# Patient Record
Sex: Male | Born: 1935 | Race: White | Hispanic: No | Marital: Married | Smoking: Never smoker
Health system: Southern US, Community
[De-identification: ages and names within clinical notes are randomized; demographics above are authoritative.]

## PROBLEM LIST (undated history)

## (undated) DIAGNOSIS — E119 Type 2 diabetes mellitus without complications: Secondary | ICD-10-CM

---

## 2008-08-02 ENCOUNTER — Inpatient Hospital Stay: Payer: Self-pay | Admitting: Specialist

## 2008-10-22 ENCOUNTER — Ambulatory Visit: Payer: Self-pay | Admitting: Family Medicine

## 2009-05-05 ENCOUNTER — Emergency Department: Payer: Self-pay | Admitting: Emergency Medicine

## 2009-05-07 ENCOUNTER — Emergency Department: Payer: Self-pay | Admitting: Emergency Medicine

## 2011-03-26 ENCOUNTER — Inpatient Hospital Stay: Payer: Self-pay | Admitting: Internal Medicine

## 2011-03-27 DIAGNOSIS — I359 Nonrheumatic aortic valve disorder, unspecified: Secondary | ICD-10-CM

## 2011-03-27 DIAGNOSIS — R7989 Other specified abnormal findings of blood chemistry: Secondary | ICD-10-CM

## 2011-04-19 ENCOUNTER — Ambulatory Visit: Payer: Self-pay | Admitting: Ophthalmology

## 2011-06-11 ENCOUNTER — Ambulatory Visit: Payer: Self-pay | Admitting: Ophthalmology

## 2011-06-18 ENCOUNTER — Ambulatory Visit: Payer: Self-pay | Admitting: Ophthalmology

## 2013-01-07 ENCOUNTER — Observation Stay: Payer: Self-pay | Admitting: Internal Medicine

## 2013-01-07 LAB — TROPONIN I
Troponin-I: <0.02 ng/mL
Troponin-I: 0.03 ng/mL

## 2013-01-07 LAB — COMPREHENSIVE METABOLIC PANEL
Albumin: 3.6 g/dL (ref 3.4–5.0)
Alkaline Phosphatase: 85 U/L (ref 50–136)
Anion Gap: 10 (ref 7–16)
Bilirubin,Total: 0.6 mg/dL (ref 0.2–1.0)
Chloride: 107 mmol/L (ref 98–107)
Creatinine: 1.48 mg/dL — ABNORMAL HIGH (ref 0.60–1.30)
Glucose: 146 mg/dL — ABNORMAL HIGH (ref 65–99)
Osmolality: 285 (ref 275–301)
SGOT(AST): 12 U/L — ABNORMAL LOW (ref 15–37)
SGPT (ALT): 18 U/L (ref 12–78)
Sodium: 139 mmol/L (ref 136–145)
Total Protein: 6.6 g/dL (ref 6.4–8.2)

## 2013-01-07 LAB — CK TOTAL AND CKMB (NOT AT ARMC)
CK, Total: 68 U/L (ref 35–232)
CK-MB: 1.9 ng/mL (ref 0.5–3.6)

## 2013-01-07 LAB — URINALYSIS, COMPLETE
Bacteria: NONE SEEN
Bilirubin,UR: NEGATIVE
Glucose,UR: NEGATIVE mg/dL (ref 0–75)
Leukocyte Esterase: NEGATIVE
Ph: 9 (ref 4.5–8.0)
Protein: NEGATIVE
Squamous Epithelial: NONE SEEN

## 2013-01-07 LAB — CBC
HGB: 13.8 g/dL (ref 13.0–18.0)
MCH: 29.5 pg (ref 26.0–34.0)
MCV: 82 fL (ref 80–100)
RBC: 4.68 10*6/uL (ref 4.40–5.90)
RDW: 13.7 % (ref 11.5–14.5)
WBC: 6.3 10*3/uL (ref 3.8–10.6)

## 2013-01-07 LAB — PRO B NATRIURETIC PEPTIDE: B-Type Natriuretic Peptide: 1417 pg/mL — ABNORMAL HIGH (ref 0–450)

## 2013-01-08 DIAGNOSIS — R079 Chest pain, unspecified: Secondary | ICD-10-CM

## 2013-01-08 DIAGNOSIS — I359 Nonrheumatic aortic valve disorder, unspecified: Secondary | ICD-10-CM

## 2013-01-08 LAB — TROPONIN I: Troponin-I: 0.05 ng/mL

## 2013-01-08 LAB — CBC WITH DIFFERENTIAL/PLATELET
Basophil #: 0 10*3/uL (ref 0.0–0.1)
Basophil %: 0.6 %
Eosinophil %: 1.1 %
HCT: 36.2 % — ABNORMAL LOW (ref 40.0–52.0)
Lymphocyte %: 20.1 %
MCH: 29.6 pg (ref 26.0–34.0)
MCHC: 35.5 g/dL (ref 32.0–36.0)
MCV: 83 fL (ref 80–100)
Monocyte #: 0.6 x10 3/mm (ref 0.2–1.0)
Neutrophil #: 4.4 10*3/uL (ref 1.4–6.5)
Neutrophil %: 68.6 %
Platelet: 136 10*3/uL — ABNORMAL LOW (ref 150–440)
RBC: 4.34 10*6/uL — ABNORMAL LOW (ref 4.40–5.90)
RDW: 13.7 % (ref 11.5–14.5)

## 2013-01-08 LAB — BASIC METABOLIC PANEL
Chloride: 109 mmol/L — ABNORMAL HIGH (ref 98–107)
Co2: 22 mmol/L (ref 21–32)
EGFR (African American): >60
EGFR (Non-African Amer.): 59 — ABNORMAL LOW
Glucose: 129 mg/dL — ABNORMAL HIGH (ref 65–99)
Sodium: 139 mmol/L (ref 136–145)

## 2013-01-08 LAB — LIPID PANEL
Ldl Cholesterol, Calc: 98 mg/dL (ref 0–100)
VLDL Cholesterol, Calc: 22 mg/dL (ref 5–40)

## 2013-01-08 LAB — CK TOTAL AND CKMB (NOT AT ARMC): CK, Total: 86 U/L (ref 35–232)

## 2013-01-09 LAB — URINE CULTURE

## 2013-03-26 ENCOUNTER — Emergency Department: Payer: Self-pay | Admitting: Internal Medicine

## 2013-03-26 LAB — COMPREHENSIVE METABOLIC PANEL
Albumin: 3.6 g/dL (ref 3.4–5.0)
Alkaline Phosphatase: 89 U/L (ref 50–136)
BUN: 23 mg/dL — ABNORMAL HIGH (ref 7–18)
Bilirubin,Total: 0.7 mg/dL (ref 0.2–1.0)
Calcium, Total: 9.2 mg/dL (ref 8.5–10.1)
Chloride: 105 mmol/L (ref 98–107)
Co2: 26 mmol/L (ref 21–32)
Creatinine: 1.06 mg/dL (ref 0.60–1.30)
EGFR (Non-African Amer.): >60
SGOT(AST): 29 U/L (ref 15–37)
SGPT (ALT): 30 U/L (ref 12–78)
Sodium: 138 mmol/L (ref 136–145)

## 2013-03-26 LAB — CBC WITH DIFFERENTIAL/PLATELET
Basophil %: 0.6 %
HCT: 38.2 % — ABNORMAL LOW (ref 40.0–52.0)
HGB: 13.3 g/dL (ref 13.0–18.0)
Lymphocyte #: 1.2 10*3/uL (ref 1.0–3.6)
Lymphocyte %: 19.5 %
MCHC: 34.8 g/dL (ref 32.0–36.0)
MCV: 84 fL (ref 80–100)
Monocyte #: 0.5 x10 3/mm (ref 0.2–1.0)
Monocyte %: 7.6 %
Neutrophil #: 4.5 10*3/uL (ref 1.4–6.5)
Neutrophil %: 71.2 %
RBC: 4.56 10*6/uL (ref 4.40–5.90)
RDW: 14.2 % (ref 11.5–14.5)
WBC: 6.3 10*3/uL (ref 3.8–10.6)

## 2013-03-26 LAB — TROPONIN I: Troponin-I: <0.02 ng/mL

## 2014-09-09 NOTE — Discharge Summary (Signed)
PATIENT NAME:  Charles Murray, Charles Murray MR#:  [id number] DATE OF BIRTH:  09-23-1930  DATE OF ADMISSION:  01/07/2013 DATE OF DISCHARGE:  01/08/2013  PRESENTING COMPLAINT:  Chest pain and dizziness.   DISCHARGE DIAGNOSES: 1.  Orthostatic hypotension suspected due to dehydration, resolved.  2.  Chest pain, appears atypical, resolved.  3.  History of cerebrovascular accident.  4.  History of myocardial infarction.   CODE STATUS:  NO CODE, DO NOT RESUSCITATE.   MEDICATIONS: 1.  Citalopram 20 mg daily.  2.  Vitamin B12 1000 mcg one injectable once a month.  3.  Lumigan 0.01% drops both eyes once a day.  4.  Aspirin 81 mg daily.  5.  Synthroid 88 mcg daily.  6.  Timolol 0.5% one drop to affected eye once a day.  7.  Enalapril 10 mg twice daily.  8.  Vitamin C 500 mg by mouth daily.  9.  Keppra 500 twice daily.  10.  Levothyroxine 25 mcg by mouth daily.  11.  Magnesium oxide 400 mg daily.  12.  Iron chews 1 tablet daily.  13.  MiraLAX one dose daily.   14.  Testosterone dose intramuscular twice a week.  15.  Jentadueto 2.5/500 1 by mouth daily.   DIET:  Regular diet.   FOLLOW-UP:  With Dr. Lovie Macadamia in 2 to 4 weeks.   IMAGING STUDIES:  1.  MRI of the brain, chronic white matter changes and diffuse atrophy.   2.  Echo Doppler, EF 55% to 60%.  Normal left ventricular systolic function, mildly dilated left atrium.  3.  Mild mitral regurgitation.  4.  EKG, A-Fib.   LABORATORY DATA:  Lipid profile within normal limits.  CBC within normal limits, except platelet count of 136 and H and H of 12.8 and 36.2.  Basic metabolic panel within normal limits.  Cardiac enzymes x 3 negative.  Ultrasound Doppler, atherosclerotic disease without evidence of hemodynamically significant stenosis.  UA negative for UTI.  Urine culture negative.   BRIEF SUMMARY OF HOSPITAL COURSE:  Charles Murray is an 81 year old Caucasian gentleman with history of MI and CVA, came in with chief complaint of dizziness and chest pain.  He  was admitted with:  1.  Dizziness, near syncope, suspected from dehydration that improved after IV fluids.  CT head was negative.  MRI brain was negative for CVA.  Doppler carotids did not show any stenosis.  The patient is on aspirin and statins.  He had mild positive orthostatics which improved after hydration.  2.  Chest pain, given past medical history of coronary artery disease.  The patient was cycled with three sets of cardiac enzymes which was negative.  The patient did not have any further chest pain.  3.  Difficulty urinating, could be from dehydration.  The patient had to undergo self-cath, in and out one time and thereafter was able to urinate well.  4.  Chronic history of paroxysmal A-Fib.  The patient is not on Coumadin according to cardiology from previous records.  Continue monitoring.  Heart rate remained stable.  5.  Diabetes.  The patient is on sliding scale and resumed his home meds.   Hospital stay otherwise remained stable.  The patient remained a NO CODE, DO NOT RESUSCITATE.   TIME SPENT:  40 minutes.    ____________________________ Hart Rochester Posey Pronto, MD sap:ea D: 01/09/2013 07:08:25 ET T: 01/09/2013 07:16:34 ET JOB#: [id number]  cc: Ilea Hilton A. Posey Pronto, MD, <Dictator> Youlanda Roys. Lovie Macadamia, MD Ilda Basset MD ELECTRONICALLY SIGNED 01/21/2013  20:04 

## 2014-09-09 NOTE — H&P (Signed)
PATIENT NAME:  Charles Murray, Charles Murray MR#:  [id number] DATE OF BIRTH:  [birth year]  DATE OF ADMISSION:  01/07/2013  PRIMARY CARE PHYSICIAN: Dr. Juluis Pitch.   REFERRING PHYSICIAN: Dr. Meriel Pica.  CARDIOLOGY: Dr. Rockey Situ.   CHIEF COMPLAINT: Chest pain and dizziness.   HISTORY OF PRESENT ILLNESS: The patient is an 81 year old Caucasian male with past medical history of two strokes, myocardial infarction status post stent and multiple other medical problems who is presenting to the ER with a chief complaint of dizziness and chest pain. The patient and his wife live at [facility], they went to a grocery store where he felt lightheaded and dizzy.  Wife took him to house because of dizziness. The patient denies any complete loss of consciousness. While on his way to the car, the patient was extremely dizzy and he stumbled over and could not recall any of that event. Denies any total loss of consciousness.   At the time of dizziness, he was diaphoretic too. The patient went home and then started having chest pain associated with shortness of breath. Wife called EMS and the patient was found to be diaphoretic and the rhythm has showed atrial fibrillation with premature atrial contractions on monitor. Eventually, the patient converted to sinus rhythm and shortness of breath and diaphoresis were resolved.   The patient is concerned that he did not have any urine output since this morning. In the ER, the patient has received 500 mL fluid bolus with no significant improvement. Bladder scan is ordered which is pending. The patient denies any chest pain or shortness of breath during my examination.   When the ER staff was trying to collect a urine sample, they had noticed a little bit of blood at the tip of his penis. The patient is admitting that he has chronic prostate problems and his physician checks his prostate-specific antigen on a regular basis.   Initial CAT scan of the head is negative.  Initial troponin is negative. The patient has a remote history of seizures and last seizure was two years ago.  PAST MEDICAL HISTORY: Insulin-dependent diabetes mellitus; two episodes of strokes in the past with expressive aphasia, some ambulatory difficulties and cognitive difficulties; coronary artery disease status post MI in the year 2002 and status post stent; non-insulin-dependent diabetes mellitus; hypertension; glaucoma; gout.   PAST SURGICAL HISTORY: Coronary artery bypass grafting in 1996, stent placement in 2002.   ALLERGIES: ALPHAGAN P AND COMBIGAN.   PSYCHOSOCIAL HISTORY: Lives in [place]. He used to smoke but quit smoking in 1961. He used to drink alcohol, quit drinking after the second episode of stroke in the year 2008. Denies any illicit drug use. Lives with wife.   FAMILY HISTORY: Mother died from breast cancer at age 15. Father died suddenly of a myocardial infarction at age 14.   HOME MEDICATIONS: Vitamin C 500 mg once daily, vitamin B12 1000 mcg injectable once a month, magnesium oxide 400 mg once daily, MiraLax 1 dose p.o. once daily, levothyroxine 125 mcg once daily. This dose needs to be clarified because another dose of 88 mcg is written on the medical reconciliation.  Keppra 500 mg 2 times a day,  iron tablet once daily, enalapril 10 mg 2 times a day, citalopram 20 mg once a day, aspirin 81 mg once daily.   REVIEW OF SYSTEMS:  CONSTITUTIONAL: Denies any fever or fatigue but complaining of weakness.  EYES: No blurry vision, double vision.  ENT: Denies epistaxis or discharge. RESPIRATORY: No cough  or COPD.  CARDIOVASCULAR: Denies any palpitations or total syncope. Had chest pain, which has  completely resolved.  GASTROINTESTINAL: Denies nausea and vomiting. He had abdominal pain yesterday from constipation, but after bowel movements, abdominal pain resolved. Today he denies any abdominal pain.  GENITOURINARY: No dysuria, hematuria. Has some prostate problems. Denies  any hernia.  ENDOCRINE: No polyuria or nocturia. Has chronic hypothyroidism and diabetes mellitus.  HEMOLYMPHATIC: Denies any easy bruising or bleeding.  INTEGUMENTARY: No acne, rash, lesions.  MUSCULOSKELETAL: No joint pain in the neck, back. Has history of gout.  NEUROLOGIC: Has history of 2 strokes in the past. No ataxia but has expressive aphasia.  PSYCHIATRIC: No ADD or OCD.   PHYSICAL EXAMINATION:  VITAL SIGNS: Temperature 97.6, pulse 80, respirations 20, blood pressure 155/69, pulse oximetry 97%.  GENERAL APPEARANCE: Not in any acute distress, moderately built and nervous.  HEENT: Normocephalic, atraumatic. Pupils are equally reactive to light and accommodation. No scleral icterus. No sinus tenderness. No postnasal drip.  NECK: Supple. No JVD. No thyromegaly. No lymphadenopathy. Range of motion is intact.  LUNGS: Clear to auscultation bilaterally. No accessory muscle usage. No anterior chest wall tenderness on palpation.  CARDIAC: S1, S2 normal. Regular rate and rhythm. Midsternal scar of the previous bypass grafting is intact and healed well. No edema.  GASTROINTESTINAL: Soft. Bowel sounds are positive in all four quadrants. Nontender, nondistended. No hepatosplenomegaly. No masses felt.  NEUROLOGIC: Awake, alert, oriented x 3. Has expressive aphasia and some cognitive difficulties but following verbal commands appropriately. Motor and sensory are grossly intact. Reflexes 2+ and no facial droop. No pronator drift. No cerebellar signs.  EXTREMITIES: No edema. No cyanosis. No clubbing.  SKIN: Warm to touch. Dry in nature. Normal turgor. No rashes. No lesions.  MALE GENITALIA:  No discharge is noticed from the penis. Wife was present during the examination as my chaperone.  PSYCHIATRIC: Normal mood and affect.  MUSCULOSKELETAL: No joint effusion, tenderness, erythema.   DIAGNOSTIC STUDIES: LFTs within normal limits. Troponin less than 0.02. WBC 6.3, hemoglobin 13.8, hematocrit 38.2,  platelets 164. BNP is 1417. Glucose 146, BUN 27, creatinine 1.48, sodium 139, potassium 4.7, chloride 107, CO2 22, GFR 44, anion gap 10, serum osmolality 285, calcium 9.2.   Chest x-ray, portable, atherosclerotic lung disease. No acute cardiopulmonary disease.   CT of the head has revealed chronic and involutional changes without evidence of acute abnormalities. If there is persistent clinical concern, further evaluation with MRI is recommended.   A 12-lead EKG has revealed a wide QRS complex, right bundle branch block, left anterior fascicular block, septal infarct age undetermined.   ASSESSMENT AND PLAN: An 81 year old Caucasian male presenting to the emergency room  with a chief complaint of dizziness and chest pain. Will be admitted following assessment and plan.  1.  Dizziness/near syncope. Will admit him to telemetry. CT head is negative. Will get neurological checks. Will obtain MRI of the brain, carotid Dopplers, and 2-D echocardiogram. The patient will be on aspirin and statin. Will check orthostatics.  2.  Gait evaluation will be done by physical therapist, as the patient is feeling weak in his lower extremities. The motor and sensory are intact.  3.  Chest pain given the past medical history of coronary artery disease, this could be from paroxysmal atrial fibrillation which is completely resolved at this time. Initial set of troponins is negative. We will cycle cardiac biomarkers to rule out acute myocardial infarction.  4.  Anuria since morning. This could be from dehydration.  The patient was given 500 mL fluid bolus. We will get bladder scan as he has some prostate issues and after doing the bladder scan,  if necessary we will insert Foley catheter. As the patient seemed to be dehydrated, we will provide gentle hydration with IV fluids.  5.  Chronic history of paroxysmal atrial fibrillation. The patient is not a Coumadin candidate according to Dr. Rogelia Boga note from the previous records.  Will continue close monitoring of the heart rate.  6.  Bloody discharge at the tip of the penis. ? Hematuria. Could be from acute cystitis. We will get urinalysis and culture and sensitivity.  7.  Chronic history of diabetes mellitus. The patient is on sliding-scale insulin and hold off his home medication in view of acute renal insufficiency.  8.  Chronic history of gout. No exacerbation.   CODE STATUS: DO NOT RESUSCITATE.   Wife is medical power of attorney. Diagnosis and plan of care was discussed in detail with the patient and his wife at bedside. They both verbalized understanding of the plan.    ____________________________ Nicholes Mango, MD ag:np D: 01/07/2013 17:59:00 ET T: 01/07/2013 19:29:26 ET JOB#: [id number]  cc: Minna Merritts, MD Nicholes Mango, MD, <Dictator>      Nicholes Mango MD ELECTRONICALLY SIGNED 01/10/2013 7:00

## 2014-09-11 NOTE — Op Note (Signed)
PATIENT NAME:  Charles Murray, Charles Murray MR#:  [id number] DATE OF BIRTH:  May 28, 1930  DATE OF PROCEDURE:  06/18/2011  PREOPERATIVE DIAGNOSES:  1. Senile cataract, right eye.  2. Uncontrolled glaucoma, right eye.   POSTOPERATIVE DIAGNOSES:  1. Senile cataract, right eye.  2. Uncontrolled glaucoma, right eye.   PROCEDURES:  1. Trabeculectomy with mitomycin and placement of Express shunt, right eye.  2. Cataract extraction with posterior chamber intraocular lens, right eye.  SURGEON: Leandrew Koyanagi, MD  LENS IMPLANTS: [place] 17.0-diopter posterior chamber intraocular lens.   GLAUCOMA IMPLANT: Express glaucoma filtration device, Version P50, Serial #[id number].  MITOMYCIN TIME: 2 minutes.  ULTRASOUND TIME: 13% of 1 minute, 30 seconds for CDE 11.4   ESTIMATED BLOOD LOSS: Less than 1 mL.   COMPLICATIONS: None.  ANESTHESIA: Retrobulbar block of Xylocaine and bupivacaine.   DESCRIPTION OF PROCEDURE: The patient was identified in the holding room and transported to the operating suite and placed in the supine position underneath the operating microscope. The right eye was identified as the operative eye and a retrobulbar block of Xylocaine and bupivacaine was administered under intravenous sedation. The eye was then prepped and draped in the usual sterile ophthalmic fashion.   A 1 mm clear-corneal paracentesis incision was made at the 12 o'clock position. The anterior chamber was filled with Viscoat. A 2.4 mm clear-corneal incision was made at the 9 o'clock position. A curvilinear capsulorrhexis was made using a cystotome and capsulorrhexis forceps. Hydrodissection and hydrodelineation were performed using balanced salt solution. Phacoemulsification was then used in stop-and-chop fashion to remove the lens, nucleus, and epinucleus. The remaining cortex was aspirated using the irrigation-aspiration handpiece. Additional Provisc was placed into the capsular bag to distend it for lens placement. A ZCBOO  17.0-diopter lens was then injected into the capsular bag. The remaining viscoelastic was aspirated from the eye. Miochol was placed into the anterior chamber. The wounds were hydrated with balanced salt solution. A 10-0 nylon suture was placed through the center of the 2.4 mm incision. All incisions were noted to be watertight with the eye maintaining a pressure above physiologic.   Attention was turned to the superonasal quadrant where a conjunctival peritomy was made from the 12 o'clock to 2 o'clock position 2 millimeters posterior to the limbus. This was dissected posteriorly to create a scleral pocket under the tenons and conjunctiva. Hemostasis was achieved with wet-field cautery. Pledgets soaked in mitomycin, 0.04%, were placed onto the scleral bed for a period of 90 seconds. These were removed and the area was copiously irrigated with balanced salt solution. A Beaver blade was then used to create a trapezoidal-shaped partial-thickness scleral flap at the 1:30 position. This was dissected forward to clear cornea. A 26-gauge needle was used to enter the anterior chamber parallel to the iris underneath the scleral flap. An Express shunt, Version P50, was placed through this opening into the anterior chamber. The scleral flap was then secured with six interrupted 10-0 nylon sutures. The anterior chamber was filled with balanced salt solution. There was a small amount of spontaneous flow through the edge of the scleral flap. The conjunctival peritomy was closed with running 9-0 Vicryl suture. The anterior chamber was inflated to a pressure above physiologic. There was spontaneous bleb formation. The corneal incisions were watertight. The pressure in the eye reduced spontaneously to a low physiologic pressure and there was no conjunctival wound leak present. Topical Vigamox drops and Maxitrol ointment were placed on the eye. The eye was patched and shielded.  ____________________________ Nila Nephew  R.  Kolbe Delmonaco, MD crb:slb D: 06/18/2011 12:22:00 ET     T: 06/18/2011 12:41:15 ET        JOB#: [id number] cc: Wyonia Hough, MD, <Dictator> Leandrew Koyanagi MD ELECTRONICALLY SIGNED 06/19/2011 11:15

## 2014-10-10 ENCOUNTER — Other Ambulatory Visit: Payer: Self-pay | Admitting: Nurse Practitioner

## 2014-10-10 DIAGNOSIS — K5909 Other constipation: Secondary | ICD-10-CM

## 2014-10-10 DIAGNOSIS — R1032 Left lower quadrant pain: Secondary | ICD-10-CM

## 2014-10-24 ENCOUNTER — Ambulatory Visit
Admission: RE | Admit: 2014-10-24 | Discharge: 2014-10-24 | Disposition: A | Discharge status: Home / Self Care | Payer: Medicare PPO | Source: Ambulatory Visit | Attending: Nurse Practitioner | Admitting: Nurse Practitioner

## 2014-10-24 DIAGNOSIS — K5909 Other constipation: Secondary | ICD-10-CM

## 2014-10-24 DIAGNOSIS — M899 Disorder of bone, unspecified: Secondary | ICD-10-CM | POA: Diagnosis not present

## 2014-10-24 DIAGNOSIS — R1032 Left lower quadrant pain: Secondary | ICD-10-CM

## 2014-10-24 DIAGNOSIS — K862 Cyst of pancreas: Secondary | ICD-10-CM | POA: Diagnosis not present

## 2014-10-24 DIAGNOSIS — I701 Atherosclerosis of renal artery: Secondary | ICD-10-CM | POA: Insufficient documentation

## 2014-10-24 DIAGNOSIS — K449 Diaphragmatic hernia without obstruction or gangrene: Secondary | ICD-10-CM | POA: Insufficient documentation

## 2014-10-24 DIAGNOSIS — I728 Aneurysm of other specified arteries: Secondary | ICD-10-CM | POA: Diagnosis not present

## 2014-10-24 HISTORY — DX: Type 2 diabetes mellitus without complications: E11.9

## 2014-10-24 MED ORDER — IOHEXOL 300 MG/ML  SOLN
100.0000 mL | Freq: Once | INTRAMUSCULAR | Status: AC | PRN
  Administered 2014-10-24: 100 mL via INTRAVENOUS

## 2015-05-29 DIAGNOSIS — F0150 Vascular dementia without behavioral disturbance: Secondary | ICD-10-CM

## 2015-05-29 DIAGNOSIS — I700 Atherosclerosis of aorta: Secondary | ICD-10-CM

## 2015-05-29 DIAGNOSIS — I350 Nonrheumatic aortic (valve) stenosis: Secondary | ICD-10-CM | POA: Diagnosis not present

## 2015-05-29 DIAGNOSIS — I10 Essential (primary) hypertension: Secondary | ICD-10-CM | POA: Diagnosis not present

## 2015-05-29 DIAGNOSIS — F39 Unspecified mood [affective] disorder: Secondary | ICD-10-CM

## 2015-05-29 DIAGNOSIS — E1169 Type 2 diabetes mellitus with other specified complication: Secondary | ICD-10-CM | POA: Diagnosis not present

## 2015-05-29 DIAGNOSIS — I482 Chronic atrial fibrillation: Secondary | ICD-10-CM | POA: Diagnosis not present

## 2015-06-08 DIAGNOSIS — G459 Transient cerebral ischemic attack, unspecified: Secondary | ICD-10-CM | POA: Diagnosis not present

## 2015-06-27 DIAGNOSIS — I350 Nonrheumatic aortic (valve) stenosis: Secondary | ICD-10-CM | POA: Diagnosis not present

## 2015-06-27 DIAGNOSIS — F39 Unspecified mood [affective] disorder: Secondary | ICD-10-CM

## 2015-06-27 DIAGNOSIS — I482 Chronic atrial fibrillation: Secondary | ICD-10-CM | POA: Diagnosis not present

## 2015-06-27 DIAGNOSIS — E1159 Type 2 diabetes mellitus with other circulatory complications: Secondary | ICD-10-CM | POA: Diagnosis not present

## 2015-06-27 DIAGNOSIS — F0150 Vascular dementia without behavioral disturbance: Secondary | ICD-10-CM | POA: Diagnosis not present

## 2015-07-19 DIAGNOSIS — F0150 Vascular dementia without behavioral disturbance: Secondary | ICD-10-CM | POA: Diagnosis not present

## 2015-07-19 DIAGNOSIS — F39 Unspecified mood [affective] disorder: Secondary | ICD-10-CM | POA: Diagnosis not present

## 2015-07-19 DIAGNOSIS — I4891 Unspecified atrial fibrillation: Secondary | ICD-10-CM | POA: Diagnosis not present

## 2015-07-19 DIAGNOSIS — E1159 Type 2 diabetes mellitus with other circulatory complications: Secondary | ICD-10-CM | POA: Diagnosis not present

## 2015-08-22 ENCOUNTER — Telehealth: Payer: Self-pay

## 2015-08-22 ENCOUNTER — Emergency Department
Admission: EM | Admit: 2015-08-22 | Discharge: 2015-08-22 | Disposition: A | Discharge status: Home / Self Care | Payer: Medicare Other | Attending: Emergency Medicine | Admitting: Emergency Medicine

## 2015-08-22 DIAGNOSIS — Y92129 Unspecified place in nursing home as the place of occurrence of the external cause: Secondary | ICD-10-CM | POA: Diagnosis not present

## 2015-08-22 DIAGNOSIS — E119 Type 2 diabetes mellitus without complications: Secondary | ICD-10-CM | POA: Diagnosis not present

## 2015-08-22 DIAGNOSIS — Y999 Unspecified external cause status: Secondary | ICD-10-CM | POA: Diagnosis not present

## 2015-08-22 DIAGNOSIS — Z0000 Encounter for general adult medical examination without abnormal findings: Secondary | ICD-10-CM | POA: Insufficient documentation

## 2015-08-22 DIAGNOSIS — W19XXXA Unspecified fall, initial encounter: Secondary | ICD-10-CM | POA: Diagnosis not present

## 2015-08-22 DIAGNOSIS — Y939 Activity, unspecified: Secondary | ICD-10-CM | POA: Insufficient documentation

## 2015-08-22 NOTE — Telephone Encounter (Signed)
PLEASE NOTE: All timestamps contained within this report are represented as [place] Standard Time. CONFIDENTIALTY NOTICE: This fax transmission is intended only for the addressee. It contains information that is legally privileged, confidential or otherwise protected from use or disclosure. If you are not the intended recipient, you are strictly prohibited from reviewing, disclosing, copying using or disseminating any of this information or taking any action in reliance on or regarding this information. If you have received this fax in error, please notify us immediately by telephone so that we can arrange for its return to [place]. Phone: [phone], Toll-Free: [phone], Fax: [phone] Page: 1 of 1 Call Id: [id number] [place] Night - Client Nonclinical Telephone Record [place] Night - Client Client Site [place] Physician Viviana Simpler Contact Type Call Who Is Calling Physician / Provider / Hospital Call Type Provider Call [facility] Page Now Reason for Call Request to speak to Physician Initial Comment Caller states Bonnita Nasuti from [place] at ph [phone] Pt fell on the floor and can't get up may have broken his hip Additional Comment Caller states Bonnita Nasuti from [place] at ph [phone] Pt fell on the floor and can't get up may have broken his hip Patient Name Charles Murray Patient DOB 1930/07/30 Requesting Provider [facility] Physician Number [phone] Facility Name [facility] Phone DateTime Result/Outcome Message Type Notes Walker Kehr [id number] 08/22/2015 1:24:34 AM Called On Call Provider - Reached Doctor Paged Walker Kehr 08/22/2015 1:24:39 AM Spoke with On Call - General Message Result Call Closed By: Cherrie Gauze Transaction Date/Time: 08/22/2015 1:12:09 AM (ET)

## 2015-08-22 NOTE — Telephone Encounter (Signed)
Per chart review tab pt was seen [facility] ED.

## 2015-08-22 NOTE — Telephone Encounter (Signed)
Fortunately no apparent injury and sent back to [facility]

## 2015-08-22 NOTE — ED Provider Notes (Signed)
Time Seen: Approximately 0247 I have reviewed the triage notes  Chief Complaint: Fall   History of Present Illness: Charles Murray is a 82 y.o. male who apparently has a history of frequent falls and normally ambulates with a walker. Patient had a non-syncopal fall this evening at a nursing facility. He was transported by EMS because they "" thought he broke his hip "". The patient this time denies any syncope and denies any pain from his fall. The patient denies any head injury, neck, thoracic, lumbar spine pain. He denies any pain in his hips at all.  Past Medical History  Diagnosis Date  . Diabetes mellitus without complication ([place])     Metformin    There are no active problems to display for this patient.   History reviewed. No pertinent past surgical history.  History reviewed. No pertinent past surgical history.  No current outpatient prescriptions on file.  Allergies:  Review of patient's allergies indicates no known allergies.  Family History: History reviewed. No pertinent family history.  Social History: Social History  Substance Use Topics  . Smoking status: Never Smoker   . Smokeless tobacco: None  . Alcohol Use: None     Review of Systems:   10 point review of systems was performed and was otherwise negative:  Constitutional: No fever Eyes: No visual disturbances ENT: No sore throat, ear pain Cardiac: No chest pain Respiratory: No shortness of breath, wheezing, or stridor Abdomen: No abdominal pain, no vomiting, No diarrhea Endocrine: No weight loss, No night sweats Extremities: No peripheral edema, cyanosis Skin: No rashes, easy bruising Neurologic: No focal weakness, trouble with speech or swollowing Urologic: No dysuria, Hematuria, or urinary frequency   Physical Exam:  ED Triage Vitals  Enc Vitals Group     BP 08/22/15 0234 154/78 mmHg     Pulse Rate 08/22/15 0234 69     Resp 08/22/15 0234 15     Temp 08/22/15 0234 98 F (36.7 C)   Temp Source 08/22/15 0234 Oral     SpO2 08/22/15 0234 97 %     Weight 08/22/15 0234 190 lb (86.183 kg)     Height 08/22/15 0234 6' (1.829 m)     Head Cir --      Peak Flow --      Pain Score 08/22/15 0235 0     Pain Loc --      Pain Edu? --      Excl. in [place]? --     General: Awake , Alert , and Oriented times 3; GCS 15 Head: Normal cephalic , atraumatic Eyes: Pupils equal , round, reactive to light Nose/Throat: No nasal drainage, patent upper airway without erythema or exudate.  Neck: Supple, Full range of motion, No anterior adenopathy or palpable thyroid masses. Neck is supple with full range of motion good flexion extension rotation I pain or neuropraxia Lungs: Clear to ascultation without wheezes , rhonchi, or rales Heart: Regular rate, regular rhythm without murmurs , gallops , or rubs Abdomen: Soft, non tender without rebound, guarding , or rigidity; bowel sounds positive and symmetric in all 4 quadrants. No organomegaly .        Extremities: Patient has full range of motion of both his lower extremities without any reproducible discomfort Neurologic: Patient normally ambulates with a walker was able to demonstrate weightbearing without discomfort Skin: warm, dry, no rashes   ED Course:  At this time I cannot ascertain any injury from the patient and has a normal  physical exam. Felt laboratory and radiologic studies were not necessary at this time. Patient reviewed transported back to [facility] facility when asked nursing facility   Assessment: * Status post fall with no significant injury  Final Clinical Impression:   Final diagnoses:  Fall, initial encounter     Plan:  Outpatient management Patient was advised to return immediately if condition worsens. Patient was advised to follow up with their primary care physician or other specialized physicians involved in their outpatient care. The patient and/or family member/power of attorney had laboratory results  reviewed at the bedside. All questions and concerns were addressed and appropriate discharge instructions were distributed by the nursing staff.            Daymon Larsen, MD 08/22/15 [id number]

## 2015-08-22 NOTE — ED Notes (Signed)
Nursing home sent pt to er after a fall - nursing home reported to ems they thought pt hip was broken - pt denies any pain and has full range of motion to both legs without difficulty or assistance

## 2015-08-22 NOTE — ED Notes (Addendum)
Per MD request pt to be ambulated - pt stood and ambulated with assitance to steady himself only - no pain or distress noted - pt bears weight equally

## 2015-08-22 NOTE — ED Notes (Signed)
Arrived via ems for fall at the nursing home - nursing home reported they thought pt had a fx hip - ems stated no evidence of fx hip and pt denies any pain

## 2015-08-22 NOTE — Discharge Instructions (Signed)
Fall Prevention in Hospitals, Adult As a hospital patient, your condition and the treatments you receive can increase your risk for falls. Some additional risk factors for falls in a hospital include:  Being in an unfamiliar environment.  Being on bed rest.  Your surgery.  Taking certain medicines.  Your tubing requirements, such as intravenous (IV) therapy or catheters. It is important that you learn how to decrease fall risks while at the hospital. Below are important tips that can help prevent falls. SAFETY TIPS FOR PREVENTING FALLS Talk about your risk of falling.  Ask your health care provider why you are at risk for falling. Is it your medicine, illness, tubing placement, or something else?  Make a plan with your health care provider to keep you safe from falls.  Ask your health care provider or pharmacist about side effects of your medicines. Some medicines can make you dizzy or affect your coordination. Ask for help.  Ask for help before getting out of bed. You may need to press your call button.  Ask for assistance in getting safely to the toilet.  Ask for a walker or cane to be put at your bedside. Ask that most of the side rails on your bed be placed up before your health care provider leaves the room.  Ask family or friends to sit with you.  Ask for things that are out of your reach, such as your glasses, hearing aids, telephone, bedside table, or call button. Follow these tips to avoid falling:  Stay lying or seated, rather than standing, while waiting for help.  Wear rubber-soled slippers or shoes whenever you walk in the hospital.  Avoid quick, sudden movements.  Change positions slowly.  Sit on the side of your bed before standing.  Stand up slowly and wait before you start to walk.  Let your health care provider know if there is a spill on the floor.  Pay careful attention to the medical equipment, electrical cords, and tubes around you.  When you  need help, use your call button by your bed or in the bathroom. Wait for one of your health care providers to help you.  If you feel dizzy or unsure of your footing, return to bed and wait for assistance.  Avoid being distracted by the TV, telephone, or another person in your room.  Do not lean or support yourself on rolling objects, such as IV poles or bedside tables.   This information is not intended to replace advice given to you by your health care provider. Make sure you discuss any questions you have with your health care provider.   Document Released: 05/03/2000 Document Revised: 05/27/2014 Document Reviewed: 01/12/2012 Elsevier Interactive Patient Education [employer].  Return to the emergency department if any significant injuries discovered. Patient appears to be doing well at this time and during this assessment. Please continue all current medications

## 2015-08-24 ENCOUNTER — Encounter: Payer: Self-pay | Admitting: Emergency Medicine

## 2015-08-24 ENCOUNTER — Emergency Department
Admission: EM | Admit: 2015-08-24 | Discharge: 2015-08-24 | Disposition: A | Discharge status: Skilled Nursing Facility | Payer: Medicare Other | Attending: Emergency Medicine | Admitting: Emergency Medicine

## 2015-08-24 ENCOUNTER — Emergency Department: Payer: Medicare Other

## 2015-08-24 DIAGNOSIS — E119 Type 2 diabetes mellitus without complications: Secondary | ICD-10-CM | POA: Diagnosis not present

## 2015-08-24 DIAGNOSIS — S70211A Abrasion, right hip, initial encounter: Secondary | ICD-10-CM | POA: Diagnosis not present

## 2015-08-24 DIAGNOSIS — Y929 Unspecified place or not applicable: Secondary | ICD-10-CM | POA: Insufficient documentation

## 2015-08-24 DIAGNOSIS — X58XXXA Exposure to other specified factors, initial encounter: Secondary | ICD-10-CM | POA: Insufficient documentation

## 2015-08-24 DIAGNOSIS — F39 Unspecified mood [affective] disorder: Secondary | ICD-10-CM | POA: Diagnosis not present

## 2015-08-24 DIAGNOSIS — Y999 Unspecified external cause status: Secondary | ICD-10-CM | POA: Diagnosis not present

## 2015-08-24 DIAGNOSIS — E1169 Type 2 diabetes mellitus with other specified complication: Secondary | ICD-10-CM | POA: Diagnosis not present

## 2015-08-24 DIAGNOSIS — Y939 Activity, unspecified: Secondary | ICD-10-CM | POA: Diagnosis not present

## 2015-08-24 DIAGNOSIS — I482 Chronic atrial fibrillation: Secondary | ICD-10-CM | POA: Diagnosis not present

## 2015-08-24 DIAGNOSIS — S79911A Unspecified injury of right hip, initial encounter: Secondary | ICD-10-CM | POA: Diagnosis present

## 2015-08-24 DIAGNOSIS — F0150 Vascular dementia without behavioral disturbance: Secondary | ICD-10-CM | POA: Diagnosis not present

## 2015-08-24 NOTE — ED Notes (Signed)
EMS called for no emergency transport, ED secretary has papers

## 2015-08-24 NOTE — ED Notes (Addendum)
Patient presents to Emergency Department via EMS with complaints of follow up from [facility] per EMS staff reported pt is in "excruiating pain, every time you touch him he yells out".    Pt moved over to stretcher without complaint.  Pt denies pain but hx of dementia, pt only oriented to name.  Pt has redness to right hip - appears to be an abrasion.  Pt was seen at [facility] ED 08/22/15 for fall and then [place].

## 2015-08-24 NOTE — Discharge Instructions (Signed)
1. [name] has an abrasion to his right hip. Please apply thin layer of Neosporin daily for 3 days. 2. Return to the ER for worsening symptoms, persistent vomiting, difficulty breathing or other concerns.  Abrasion An abrasion is a cut or scrape on the outer surface of your skin. An abrasion does not extend through all of the layers of your skin. It is important to care for your abrasion properly to prevent infection. CAUSES Most abrasions are caused by falling on or gliding across the ground or another surface. When your skin rubs on something, the outer and inner layer of skin rubs off.  SYMPTOMS A cut or scrape is the main symptom of this condition. The scrape may be bleeding, or it may appear red or pink. If there was an associated fall, there may be an underlying bruise. DIAGNOSIS An abrasion is diagnosed with a physical exam. TREATMENT Treatment for this condition depends on how large and deep the abrasion is. Usually, your abrasion will be cleaned with water and mild soap. This removes any dirt or debris that may be stuck. An antibiotic ointment may be applied to the abrasion to help prevent infection. A bandage (dressing) may be placed on the abrasion to keep it clean. You may also need a tetanus shot. HOME CARE INSTRUCTIONS Medicines  Take or apply medicines only as directed by your health care provider.  If you were prescribed an antibiotic ointment, finish all of it even if you start to feel better. Wound Care  Clean the wound with mild soap and water 2-3 times per day or as directed by your health care provider. Pat your wound dry with a clean towel. Do not rub it.  There are many different ways to close and cover a wound. Follow instructions from your health care provider about:  Wound care.  Dressing changes and removal.  Check your wound every day for signs of infection. Watch for:  Redness, swelling, or pain.  Fluid, blood, or pus. General Instructions  Keep the  dressing dry as directed by your health care provider. Do not take baths, swim, use a hot tub, or do anything that would put your wound underwater until your health care provider approves.  If there is swelling, raise (elevate) the injured area above the level of your heart while you are sitting or lying down.  Keep all follow-up visits as directed by your health care provider. This is important. SEEK MEDICAL CARE IF:  You received a tetanus shot and you have swelling, severe pain, redness, or bleeding at the injection site.  Your pain is not controlled with medicine.  You have increased redness, swelling, or pain at the site of your wound. SEEK IMMEDIATE MEDICAL CARE IF:  You have a red streak going away from your wound.  You have a fever.  You have fluid, blood, or pus coming from your wound.  You notice a bad smell coming from your wound or your dressing.   This information is not intended to replace advice given to you by your health care provider. Make sure you discuss any questions you have with your health care provider.   Document Released: 02/13/2005 Document Revised: 01/25/2015 Document Reviewed: 05/04/2014 Elsevier Interactive Patient Education [employer].

## 2015-08-24 NOTE — ED Notes (Signed)
Pt informed to return if any life threatening symptoms occur.  Pt taken back by EMS.

## 2015-08-24 NOTE — ED Notes (Signed)
[place] alarm attached

## 2015-08-24 NOTE — ED Notes (Addendum)
Report called to Benjamine Mola, Therapist, sports at [facility],   Attempted several numbers between [phone] and [phone] at [facility] to givwe report

## 2015-08-24 NOTE — ED Provider Notes (Signed)
[facility] Emergency Department Provider Note  ____________________________________________  Time seen: Approximately 4:16 AM  I have reviewed the triage vital signs and the nursing notes.   HISTORY  Chief Complaint Follow-up  Limited by dementia  HPI Charles Murray is a 82 y.o. male who presents to the ED via EMS from [facility] with a chief complaint of right hip pain. Patient was seen in the ED 08/22/2015 with reported right hip pain. He was ambulatory without pain at that time and thus no imaging studies were obtained. Per EMS, nursing staff reports patient "in excruciating pain, every time you touch him he yells out". Of note, patient was able to move over to the ED stretcher without complaint. Currently denies right hip pain. Denies recent fever, chills, chest pain, shortness of breath, abdominal pain, nausea, vomiting, diarrhea. Does not know if he fell originally.   Past Medical History  Diagnosis Date  . Diabetes mellitus without complication ([place])     Metformin  Dementia  There are no active problems to display for this patient.   History reviewed. No pertinent past surgical history.  No current outpatient prescriptions on file.  Allergies Review of patient's allergies indicates no known allergies.  History reviewed. No pertinent family history.  Social History Social History  Substance Use Topics  . Smoking status: Never Smoker   . Smokeless tobacco: None  . Alcohol Use: None    Review of Systems  Constitutional: No fever/chills. Eyes: No visual changes. ENT: No sore throat. Cardiovascular: Denies chest pain. Respiratory: Denies shortness of breath. Gastrointestinal: No abdominal pain.  No nausea, no vomiting.  No diarrhea.  No constipation. Genitourinary: Negative for dysuria. Musculoskeletal: Positive for right hip pain. Negative for back pain. Skin: Negative for rash. Neurological: Negative for headaches, focal  weakness or numbness.  10-point ROS otherwise negative.  ____________________________________________   PHYSICAL EXAM:  VITAL SIGNS: ED Triage Vitals  Enc Vitals Group     BP 08/24/15 0402 146/72 mmHg     Pulse Rate 08/24/15 0402 78     Resp 08/24/15 0402 18     Temp 08/24/15 0402 98.2 F (36.8 C)     Temp Source 08/24/15 0402 Oral     SpO2 08/24/15 0402 98 %     Weight 08/24/15 0402 190 lb (86.183 kg)     Height 08/24/15 0402 5\' 9"  (1.753 m)     Head Cir --      Peak Flow --      Pain Score --      Pain Loc --      Pain Edu? --      Excl. in [place]? --     Constitutional: Alert and oriented. Well appearing and in no acute distress. Eyes: Conjunctivae are normal. PERRL. EOMI. Head: Atraumatic. Nose: No congestion/rhinnorhea. Mouth/Throat: Mucous membranes are moist.  Oropharynx non-erythematous. Neck: No stridor.  No cervical spine tenderness to palpation. Cardiovascular: Normal rate, regular rhythm. Grossly normal heart sounds.  Good peripheral circulation. Respiratory: Normal respiratory effort.  No retractions. Lungs CTAB. Gastrointestinal: Soft and nontender. No distention. No abdominal bruits. No CVA tenderness. Musculoskeletal: Abrasion to right lateral hip. Nontender to palpation. Patient has full range of motion of the hip without pain. There is no shortening or rotation. Calf is supple without evidence for compartment syndrome. Leg is symmetrically warm without evidence for ischemia. There is no pedal edema.  No joint effusions. Neurologic:  Normal speech and language. No gross focal neurologic deficits are  appreciated.  Skin:  Skin is warm, dry and intact. No rash noted. Psychiatric: Mood and affect are normal. Speech and behavior are normal.  ____________________________________________   LABS (all labs ordered are listed, but only abnormal results are displayed)  Labs Reviewed - No data to  display ____________________________________________  EKG  None ____________________________________________  RADIOLOGY  Right hip x-rays with pelvis (viewed by me, interpreted per Dr. Marisue Humble): Chronic change about both hips. No acute fracture or dislocation. ____________________________________________   PROCEDURES  Procedure(s) performed: None  Critical Care performed: No  ____________________________________________   INITIAL IMPRESSION / ASSESSMENT AND PLAN / ED COURSE  Pertinent labs & imaging results that were available during my care of the patient were reviewed by me and considered in my medical decision making (see chart for details).  82 year old male sent from the nursing home for reported right hip pain. Patient is currently pleasant and cooperative without pain. Abrasion noted to right hip. Will obtain x-ray imaging studies.  ----------------------------------------- 5:35 AM on 08/24/2015 -----------------------------------------  Updated patient and negative imaging studies. Patient is ambulatory without pain. Strict return precautions given. Patient verbalizes understanding and agrees with plan of care. ____________________________________________   FINAL CLINICAL IMPRESSION(S) / ED DIAGNOSES  Final diagnoses:  Hip abrasion, right, initial encounter      Paulette Blanch, MD 08/24/15 [id number]

## 2015-09-12 DIAGNOSIS — H1030 Unspecified acute conjunctivitis, unspecified eye: Secondary | ICD-10-CM | POA: Diagnosis not present

## 2015-09-12 DIAGNOSIS — J208 Acute bronchitis due to other specified organisms: Secondary | ICD-10-CM | POA: Diagnosis not present

## 2015-10-25 DIAGNOSIS — E039 Hypothyroidism, unspecified: Secondary | ICD-10-CM | POA: Diagnosis not present

## 2015-10-25 DIAGNOSIS — F0150 Vascular dementia without behavioral disturbance: Secondary | ICD-10-CM

## 2015-10-25 DIAGNOSIS — I350 Nonrheumatic aortic (valve) stenosis: Secondary | ICD-10-CM | POA: Diagnosis not present

## 2015-10-25 DIAGNOSIS — I4891 Unspecified atrial fibrillation: Secondary | ICD-10-CM | POA: Diagnosis not present

## 2015-10-25 DIAGNOSIS — E1151 Type 2 diabetes mellitus with diabetic peripheral angiopathy without gangrene: Secondary | ICD-10-CM | POA: Diagnosis not present

## 2015-10-25 DIAGNOSIS — F39 Unspecified mood [affective] disorder: Secondary | ICD-10-CM

## 2015-11-09 ENCOUNTER — Telehealth: Payer: Self-pay | Admitting: Family Medicine

## 2015-11-09 NOTE — Telephone Encounter (Signed)
She is concerned about the trazodone--not helping and ?some oversedation at other times On sertraline for depression also--- she is okay with more trial of this though not sure he has more than a situational depression Will stop the trazodone and reevaluate how he does  Also wonders about oral lesions--will have nurse check

## 2015-11-09 NOTE — Telephone Encounter (Signed)
Spouse called stating she is concerned about the medication that pt is taking and its side effects pt is having And would like to talk to dr Silvio Pate

## 2015-11-10 DIAGNOSIS — K0889 Other specified disorders of teeth and supporting structures: Secondary | ICD-10-CM | POA: Diagnosis not present

## 2015-11-10 DIAGNOSIS — K600 Acute anal fissure: Secondary | ICD-10-CM | POA: Diagnosis not present

## 2015-11-30 DIAGNOSIS — I872 Venous insufficiency (chronic) (peripheral): Secondary | ICD-10-CM | POA: Diagnosis not present

## 2015-12-16 IMAGING — CT CT ABD-PELV W/ CM
1 of 3 series · 11 of 32 positions shown, 15 images · IV contrast (omnipaque)
Comparison: 03/26/2013

CLINICAL DATA: 84-year-old male with a history of abdominal pain

EXAM:
CT ABDOMEN AND PELVIS WITH CONTRAST
TECHNIQUE: Multidetector CT imaging of the abdomen and pelvis was performed
using the standard protocol following bolus administration of
intravenous contrast.
CONTRAST:  100mL OMNIPAQUE IOHEXOL 300 MG/ML  SOLN

[Series 2: routine abd pel with · axial · 0.75mm/px · z∈[-431,-31]mm · 11 of 95 slices shown, 15 images]
[im 10/95  soft-tissue]
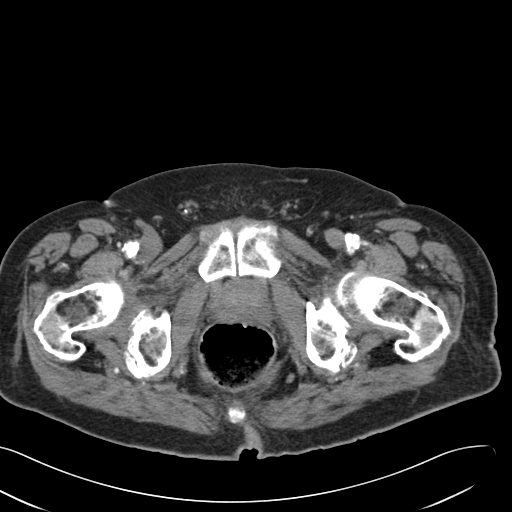
[im 10/95  bone]
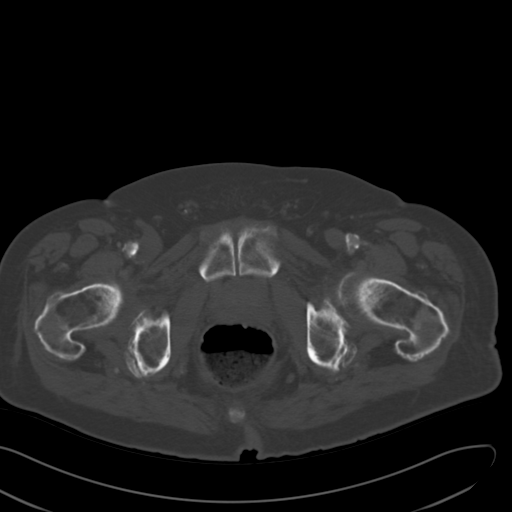
[im 19/95  soft-tissue]
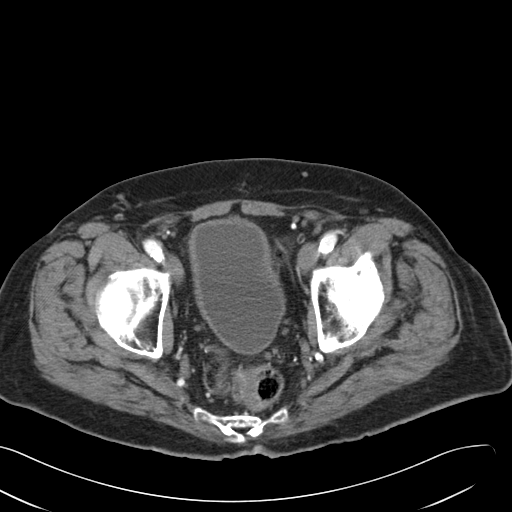
[im 29/95  soft-tissue]
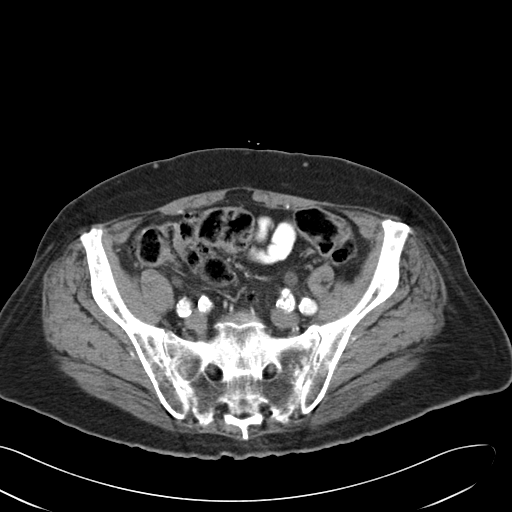
[im 38/95  soft-tissue]
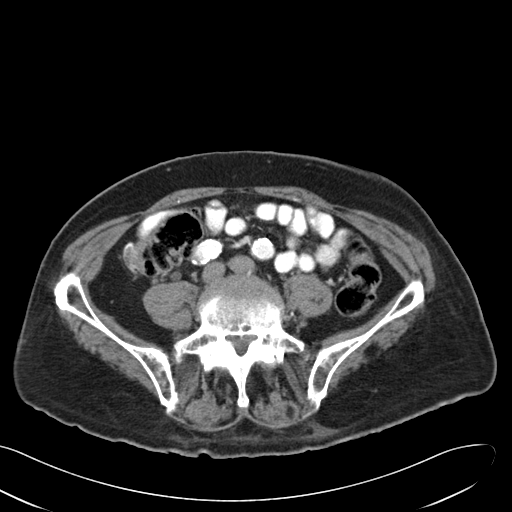
[im 48/95  soft-tissue]
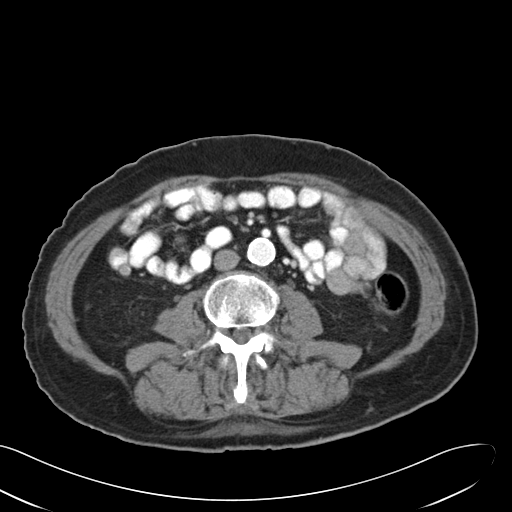
[im 57/95  soft-tissue]
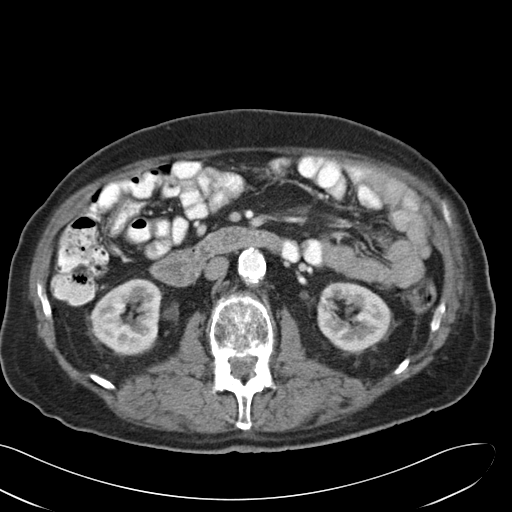
[im 66/95  soft-tissue]
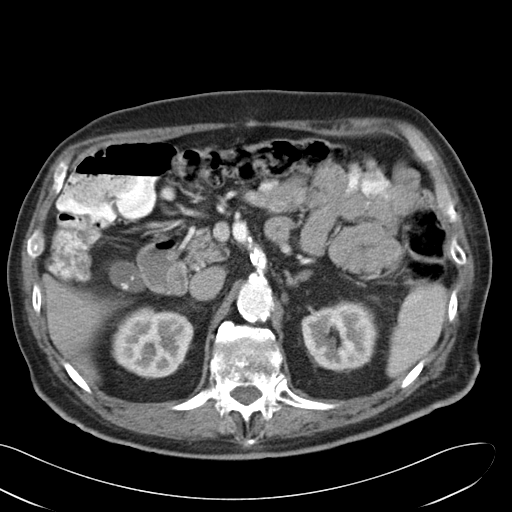
[im 76/95  soft-tissue]
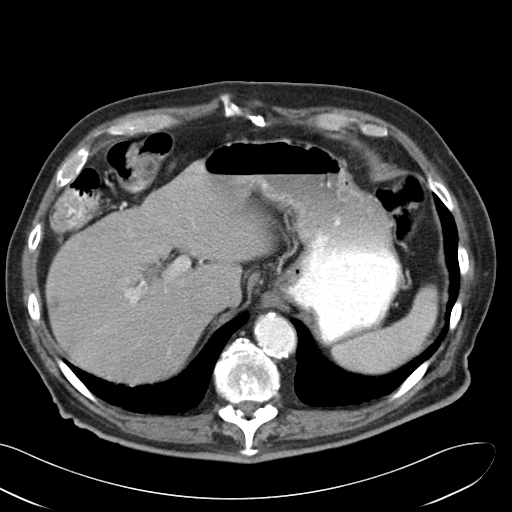
[im 76/95  lung]
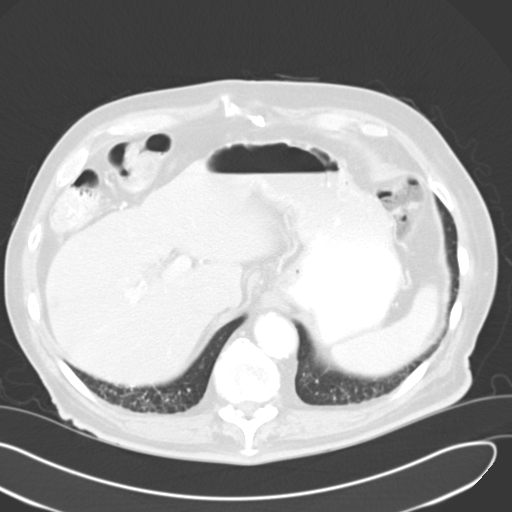
[im 80/95  lung]
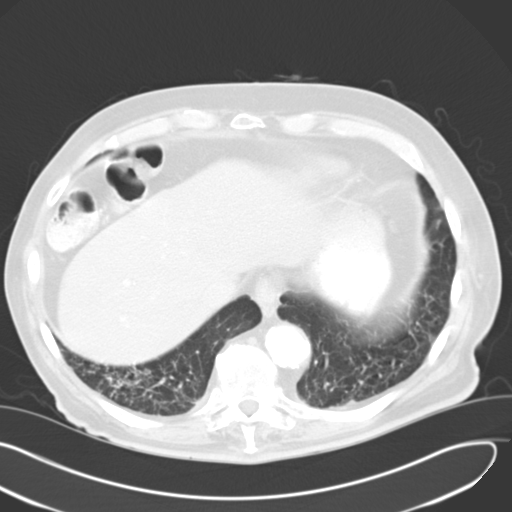
[im 85/95  soft-tissue]
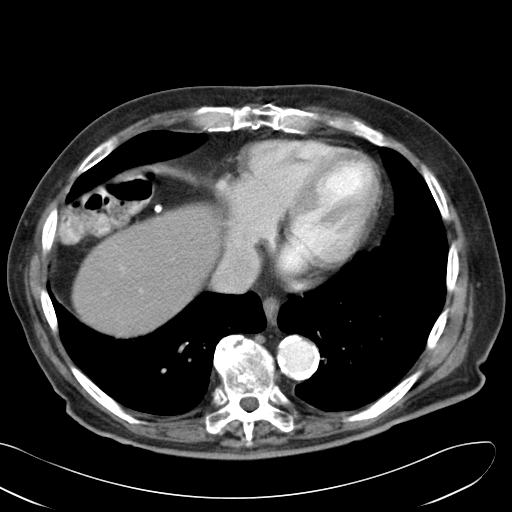
[im 85/95  lung]
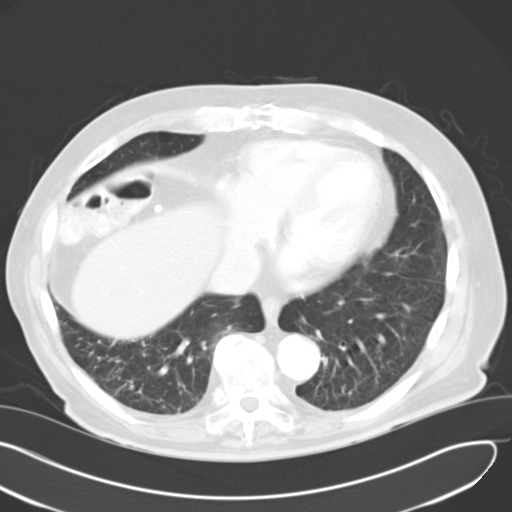
[im 85/95  bone]
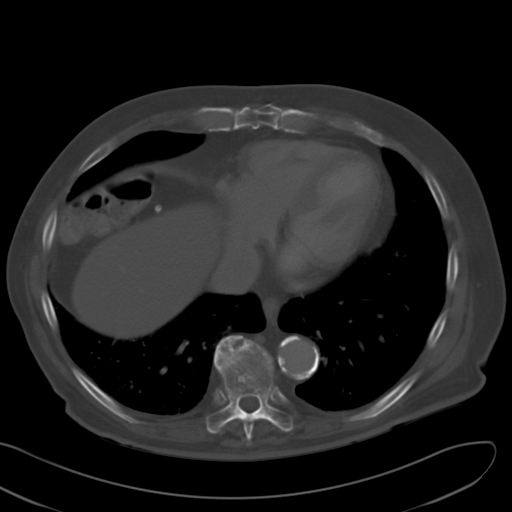
[im 90/95  lung]
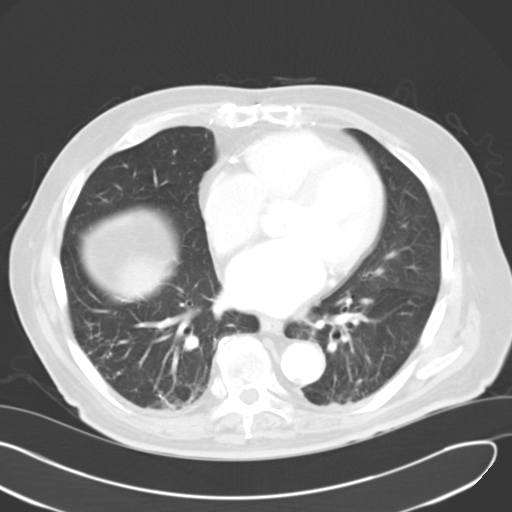

[11 of 32 positions shown; findings below may reference images not displayed]

FINDINGS: Lower chest:

Surgical changes of prior median sternotomy.

Heart size unremarkable.  No pericardial fluid/ thickening.

Calcifications of the aortic valve. Calcifications of the native
right coronary artery, lad, and circumflex coronary artery.

Small hiatal hernia.

Architectural distortion/ scarring of the bilateral lung bases. No
confluent airspace disease.

Abdomen/pelvis:

Unremarkable appearance of the liver and spleen.

Unremarkable bilateral adrenal glands.

Low-density lesion involving the head of the pancreas, potentially
involving the ductal system.

No peripancreatic fluid or inflammatory changes.

Multiple radiopaque gallstones. No pericholecystic fluid or
inflammatory changes. No intrahepatic or extrahepatic biliary ductal
dilatation.

There is trace perinephric stranding bilaterally. No evidence of
hydronephrosis involving the left or right kidney.

Calcifications versus nonobstructive kidney stones of the right
kidney. No evidence of left-sided nephrolithiasis.

Unremarkable course of the bilateral ureters with no stones
identified.

No abnormally distended small bowel or colon. Enteric contrast
reaches the colon, extending to the splenic flexure.

Normal appendix. Multiple colonic diverticula, without associated
inflammatory changes.

No free air or significant free fluid.

No mesenteric adenopathy.  No retroperitoneal adenopathy.

Vasculature:

Calcifications along the length of the abdominal aorta, extending
into the bilateral iliofemoral systems.

Right lower extremity:

Dense calcified ostial lesion at the proximal right common iliac
artery. The artery does fill with contrast beyond this. Right
hypogastric artery appears patent. Multiple calcific plaques of the
right iliac system involving both the common iliac artery in the
external iliac artery extending into the common femoral artery and
proximal SFA.

Left lower extremity:

Calcifications of the proximal left common iliac artery and the
external iliac artery. Calcifications extend into the left common
femoral artery.

There appears to be proximal occlusion of the left hypogastric
artery with likely collateral filling of the left pelvic vessels.

Mesenteric vessels:  New

Calcifications of bilateral renal artery origins. Beyond the
origins, the vessels fill, with symmetric parenchymal phase of the
left and right kidney.

As was seen on the prior CT, there are dense calcifications at the
origin of the celiac artery and the superior mesenteric artery.
Beyond the calcifications, both the celiac artery and SMA fill with
contrast, either from residual lumen or collateral flow.

Calcifications involve the ostium of the inferior mesenteric artery
which demonstrates poststenotic dilation. Collateral flow
contributes to the gastroepiploic vasculature in the upper abdomen.

Aneurysm of the gastroduodenal artery system, likely of a
superior/anterior pancreaticoduodenal branch measures 9 mm. This is
essentially unchanged from the comparison CT.

Prostate diameter measures 5.3 cm.

No displaced fractures. Multilevel degenerative disc disease of the
thoracic and lumbar spine. Degenerative changes of the bilateral
hips. Serpiginous margin of sub chondral lesion of the right femoral
head. Sclerotic changes of the left femoral head, with questionable
AVN.
IMPRESSION: Extensive atherosclerosis, which contributes to at least high-grade
if not occlusion of both the celiac artery and the superior
mesenteric artery. The mesenteric vessels are patent beyond the
ostia, either from residual lumen or from collateral flow. In
addition, there is stenosis of the inferior mesenteric artery, which
contributes to at least partial filling of the vasculature of the
upper abdomen (Marginal artery of [place], [place] of [place]). These
findings are suggestive of chronic mesenteric ischemia given the
patient's history, and correlation with a history of food aversion,
pain after eating, etc, may be useful.

Atherosclerotic changes at the origin of the bilateral renal
arteries, though percentage stenosis is difficult to quantify by CT
given the calcifications.

Atherosclerotic changes contributing to multilevel stenoses of the
bilateral iliac systems, and femoral popliteal systems. Further
evaluation with noninvasive lower extremity arterial examination may
be useful if the patient has a history of claudication.

Cystic lesion involving the pancreatic head. This may simply be
related to ductal dilatation, although a pancreatic neoplasm such as
an IPMN could have this appearance. Follow-up with pancreatic
protocol MR is recommended.

These results were called by telephone at the time of interpretation
on 10/24/2014 at [DATE] to Dr. HAWA TADROS , who verbally
acknowledged these results.

Small aneurysm measuring 9 mm involving branches of the GDA, likely
a superior/anterior pancreaticoduodenal branch, unchanged from
prior. Attention on future follow-up studies recommended.

No evidence of hydronephrosis or nephrolithiasis. There is trace
perinephric stranding, and if there is concern for urinary tract
infection, correlation with urinalysis may be useful.

Small hiatal hernia.

Sclerotic lesion of the right femoral head, potentially representing
avascular necrosis. Questionable AVN of the left femoral head.
Recommend correlation with the patient's history.

Additional incidental findings as above.

## 2015-12-26 DIAGNOSIS — I25119 Atherosclerotic heart disease of native coronary artery with unspecified angina pectoris: Secondary | ICD-10-CM | POA: Diagnosis not present

## 2015-12-26 DIAGNOSIS — E1142 Type 2 diabetes mellitus with diabetic polyneuropathy: Secondary | ICD-10-CM | POA: Diagnosis not present

## 2015-12-26 DIAGNOSIS — F339 Major depressive disorder, recurrent, unspecified: Secondary | ICD-10-CM

## 2015-12-26 DIAGNOSIS — D438 Neoplasm of uncertain behavior of other specified parts of central nervous system: Secondary | ICD-10-CM | POA: Diagnosis not present

## 2015-12-26 DIAGNOSIS — I480 Paroxysmal atrial fibrillation: Secondary | ICD-10-CM | POA: Diagnosis not present

## 2016-01-29 ENCOUNTER — Telehealth: Payer: Self-pay

## 2016-01-29 NOTE — Telephone Encounter (Signed)
PLEASE NOTE: All timestamps contained within this report are represented as [place] Standard Time. CONFIDENTIALTY NOTICE: This fax transmission is intended only for the addressee. It contains information that is legally privileged, confidential or otherwise protected from use or disclosure. If you are not the intended recipient, you are strictly prohibited from reviewing, disclosing, copying using or disseminating any of this information or taking any action in reliance on or regarding this information. If you have received this fax in error, please notify us immediately by telephone so that we can arrange for its return to [place]. Phone: [phone], Toll-Free: [phone], Fax: [phone] Page: 1 of 2 Call Id: [id number] [place] Patient Name: Charles Murray Gender: Male DOB: 09/15/1930 Age: 82 Y 66 M 26 D Return Phone Number: [phone] (Primary) Address: City/State/Zip: [place] Client [place] Night - Client Client Site [place] Physician Viviana Simpler - MD Contact Type Call Who Is Calling Patient / Member / Family / Caregiver Call Type Triage / Clinical Caller Name Abigail Butts @ [facility] Relationship To Patient [place] Return Phone Number [phone] (Primary) Chief Complaint Finger Injury Reason for Call Symptomatic / Request for Health Information Initial Comment Pt's left thumb looks dislocated PreDisposition InappropriateToAsk Translation No Nurse Assessment Nurse: Ouida Sills, RN, Sharyn Lull Date/Time (Eastern Time): 01/27/2016 1:28:56 AM Confirm and document reason for call. If symptomatic, describe symptoms. You must click the next button to save text entered. ---caller is Abigail Butts at [facility], caller states thumb looks dislocated, pt does not have pain at all, caller states was told by CNA it did not look  that way this morning, thumb is curved and dislocated at the joint, no injury known, able to move joint, pt has hx of vascular dementia Has the patient traveled out of the country within the last 30 days? ---No Does the patient have any new or worsening symptoms? ---Yes Will a triage be completed? ---Yes Related visit to physician within the last 2 weeks? ---Yes Does the PT have any chronic conditions? (i.e. diabetes, asthma, etc.) ---Yes List chronic conditions. ---vascular dementia, heart disease, Afib, aphasia Is this a behavioral health or substance abuse call? ---No Guidelines Guideline Title Affirmed Question Affirmed Notes Nurse Date/Time (Eastern Time) Finger Injury Looks like a dislocated joint (e.g., crooked or deformed) Ouida Sills, RN, Sharyn Lull 01/27/2016 1:31:09 AM Disp. Time Eilene Ghazi Time) Disposition Final User 01/27/2016 1:39:49 AM Called On-Call Provider Ouida Sills, RN, Sharyn Lull PLEASE NOTE: All timestamps contained within this report are represented as [place] Standard Time. CONFIDENTIALTY NOTICE: This fax transmission is intended only for the addressee. It contains information that is legally privileged, confidential or otherwise protected from use or disclosure. If you are not the intended recipient, you are strictly prohibited from reviewing, disclosing, copying using or disseminating any of this information or taking any action in reliance on or regarding this information. If you have received this fax in error, please notify us immediately by telephone so that we can arrange for its return to [place]. Phone: [phone], Toll-Free: [phone], Fax: [phone] Page: 2 of 2 Call Id: [id number] 01/27/2016 1:35:48 AM Go to ED Now Yes Ouida Sills, RN, Julio Sicks Understands: Yes Disagree/Comply: Comply Care Advice Given Per Guideline GO TO ED NOW: You need to be seen in the Emergency Department. Go to the ER at ___________ [place] now. Drive carefully. * Remove any  rings or jewelry from the injured finger. *  Tape the injured finger to the finger next to it (this is called a buddy splint). LOCAL COLD: Apply a cold pack or soak in cold water for 20 minutes. Comments User: Myrtie Cruise, RN Date/Time Eilene Ghazi Time): 01/27/2016 1:32:57 AM pt has a do not hospitalize User: Myrtie Cruise, RN Date/Time Eilene Ghazi Time): 01/27/2016 1:34:11 AM caller states bottom part of thumb is popped out of place, its L shaped caller states she can push on it and pull on it Referrals REFERRED TO PCP OFFICE REFERRED TO PCP OFFICE Paging DoctorName Phone DateTime Result/Outcome Message Type Notes Howard Pouch [id number] 01/27/2016 1:39:49 AM Called On Call Provider - Reached Doctor Paged Howard Pouch 01/27/2016 1:41:19 AM Spoke with On Call - General Message Result spoke to oncall Dr notified that thumb looks popped out of place at the base and looks dislocated, instructed pt needs to go in to the ER to have thumb fixed, notified Abigail Butts verbalizes understanding

## 2016-01-29 NOTE — Telephone Encounter (Signed)
No note for me this morning--I will follow up tomorrow

## 2016-02-23 DIAGNOSIS — E079 Disorder of thyroid, unspecified: Secondary | ICD-10-CM | POA: Diagnosis not present

## 2016-02-23 DIAGNOSIS — F0150 Vascular dementia without behavioral disturbance: Secondary | ICD-10-CM | POA: Diagnosis not present

## 2016-02-23 DIAGNOSIS — I700 Atherosclerosis of aorta: Secondary | ICD-10-CM

## 2016-02-23 DIAGNOSIS — E1159 Type 2 diabetes mellitus with other circulatory complications: Secondary | ICD-10-CM | POA: Diagnosis not present

## 2016-02-23 DIAGNOSIS — F39 Unspecified mood [affective] disorder: Secondary | ICD-10-CM

## 2016-02-23 DIAGNOSIS — I4891 Unspecified atrial fibrillation: Secondary | ICD-10-CM | POA: Diagnosis not present

## 2016-02-28 DIAGNOSIS — H6122 Impacted cerumen, left ear: Secondary | ICD-10-CM | POA: Diagnosis not present

## 2016-04-23 DIAGNOSIS — I700 Atherosclerosis of aorta: Secondary | ICD-10-CM | POA: Diagnosis not present

## 2016-04-23 DIAGNOSIS — E1151 Type 2 diabetes mellitus with diabetic peripheral angiopathy without gangrene: Secondary | ICD-10-CM | POA: Diagnosis not present

## 2016-04-23 DIAGNOSIS — F0150 Vascular dementia without behavioral disturbance: Secondary | ICD-10-CM

## 2016-04-23 DIAGNOSIS — I482 Chronic atrial fibrillation: Secondary | ICD-10-CM | POA: Diagnosis not present

## 2016-04-23 DIAGNOSIS — F39 Unspecified mood [affective] disorder: Secondary | ICD-10-CM | POA: Diagnosis not present

## 2016-05-23 DIAGNOSIS — R1032 Left lower quadrant pain: Secondary | ICD-10-CM | POA: Diagnosis not present

## 2016-06-14 DIAGNOSIS — E1151 Type 2 diabetes mellitus with diabetic peripheral angiopathy without gangrene: Secondary | ICD-10-CM | POA: Diagnosis not present

## 2016-06-14 DIAGNOSIS — R05 Cough: Secondary | ICD-10-CM | POA: Diagnosis not present

## 2016-06-14 DIAGNOSIS — R509 Fever, unspecified: Secondary | ICD-10-CM | POA: Diagnosis not present

## 2016-06-18 ENCOUNTER — Telehealth: Payer: Self-pay | Admitting: Family Medicine

## 2016-06-18 NOTE — Telephone Encounter (Signed)
Patient's wife, Charles Murray wants a call back from Dr. Silvio Pate about the classification of her husband for skilled nursing.

## 2016-06-21 DIAGNOSIS — E039 Hypothyroidism, unspecified: Secondary | ICD-10-CM | POA: Diagnosis not present

## 2016-06-21 DIAGNOSIS — I700 Atherosclerosis of aorta: Secondary | ICD-10-CM | POA: Diagnosis not present

## 2016-06-21 DIAGNOSIS — I4891 Unspecified atrial fibrillation: Secondary | ICD-10-CM | POA: Diagnosis not present

## 2016-06-21 DIAGNOSIS — F0150 Vascular dementia without behavioral disturbance: Secondary | ICD-10-CM | POA: Diagnosis not present

## 2016-06-21 DIAGNOSIS — E1151 Type 2 diabetes mellitus with diabetic peripheral angiopathy without gangrene: Secondary | ICD-10-CM | POA: Diagnosis not present

## 2016-06-21 DIAGNOSIS — F323 Major depressive disorder, single episode, severe with psychotic features: Secondary | ICD-10-CM | POA: Diagnosis not present

## 2016-06-26 NOTE — Telephone Encounter (Signed)
Charles Murray called back.  Please call her back on her cell phone [phone].

## 2016-08-09 DIAGNOSIS — F0150 Vascular dementia without behavioral disturbance: Secondary | ICD-10-CM | POA: Diagnosis not present

## 2016-08-09 DIAGNOSIS — R4181 Age-related cognitive decline: Secondary | ICD-10-CM | POA: Diagnosis not present

## 2016-08-30 ENCOUNTER — Telehealth: Payer: Self-pay | Admitting: Internal Medicine

## 2016-08-30 NOTE — Telephone Encounter (Signed)
Patient's wife called to find out if Death Certificate had been mailed to the funeral home.  I let her know is was mailed yesterday.  Patient's wife wanted me to let Dr.Letvak know that she appreciated his care for her husband.

## 2016-08-31 NOTE — Telephone Encounter (Signed)
Noted I saw her just before he died and expressed my condolences

## 2016-09-17 DEATH — deceased

## 2016-10-15 IMAGING — CR DG HIP (WITH OR WITHOUT PELVIS) 2-3V*R*
1 series · 4 of 4 positions shown · non-contrast
Comparison: Reformats from CT abdomen/ pelvis 10/24/2014

CLINICAL DATA: Right hip abrasion.

EXAM:
DG HIP (WITH OR WITHOUT PELVIS) 2-3V RIGHT

[Series 1: t pelvis ap · 0.14mm/px · 4 of 4 slices shown]
[im 1/4]
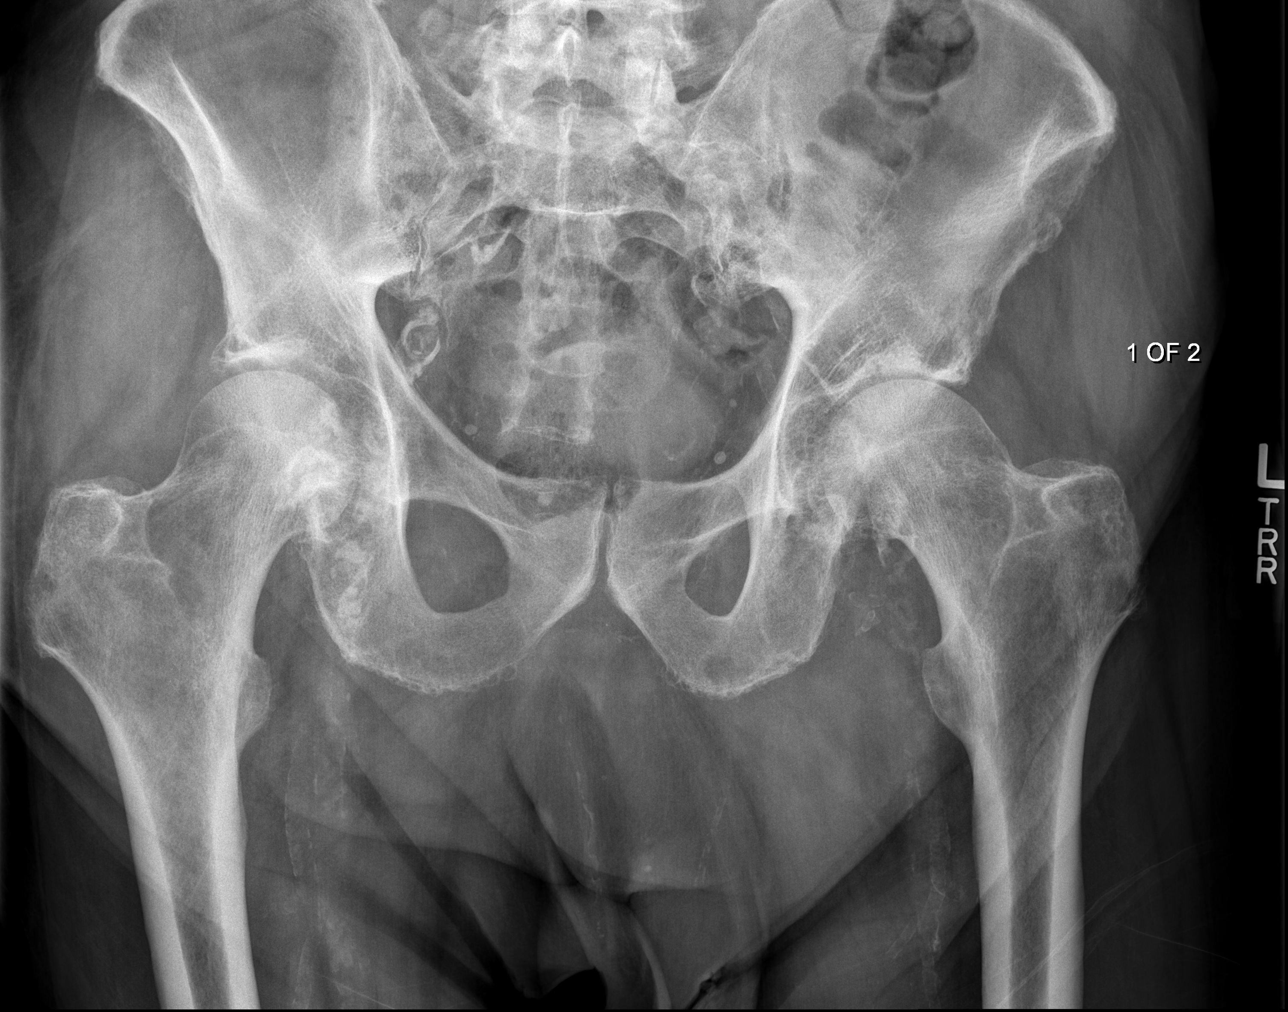
[im 2/4]
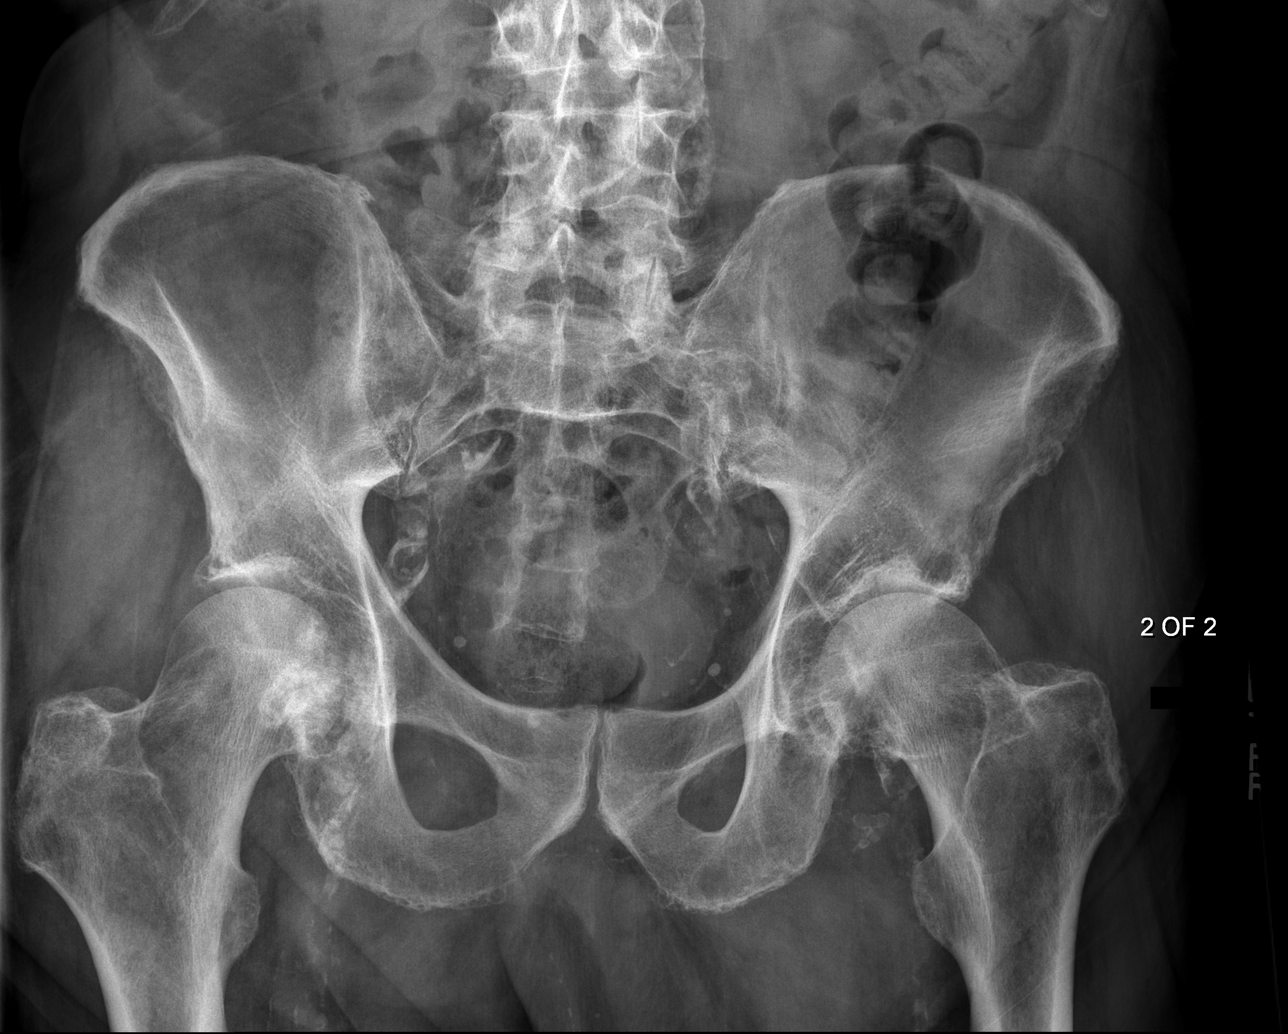
[im 3/4]
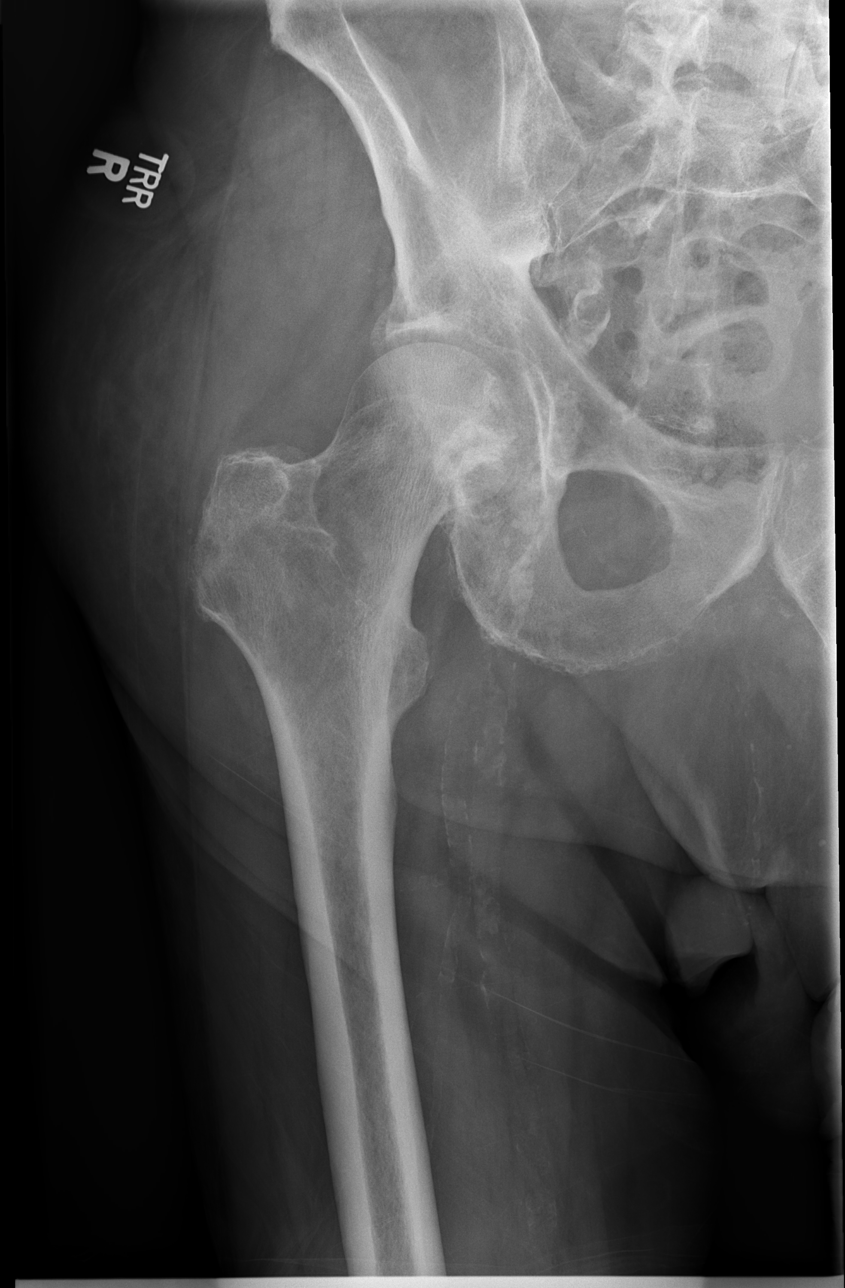
[im 4/4]
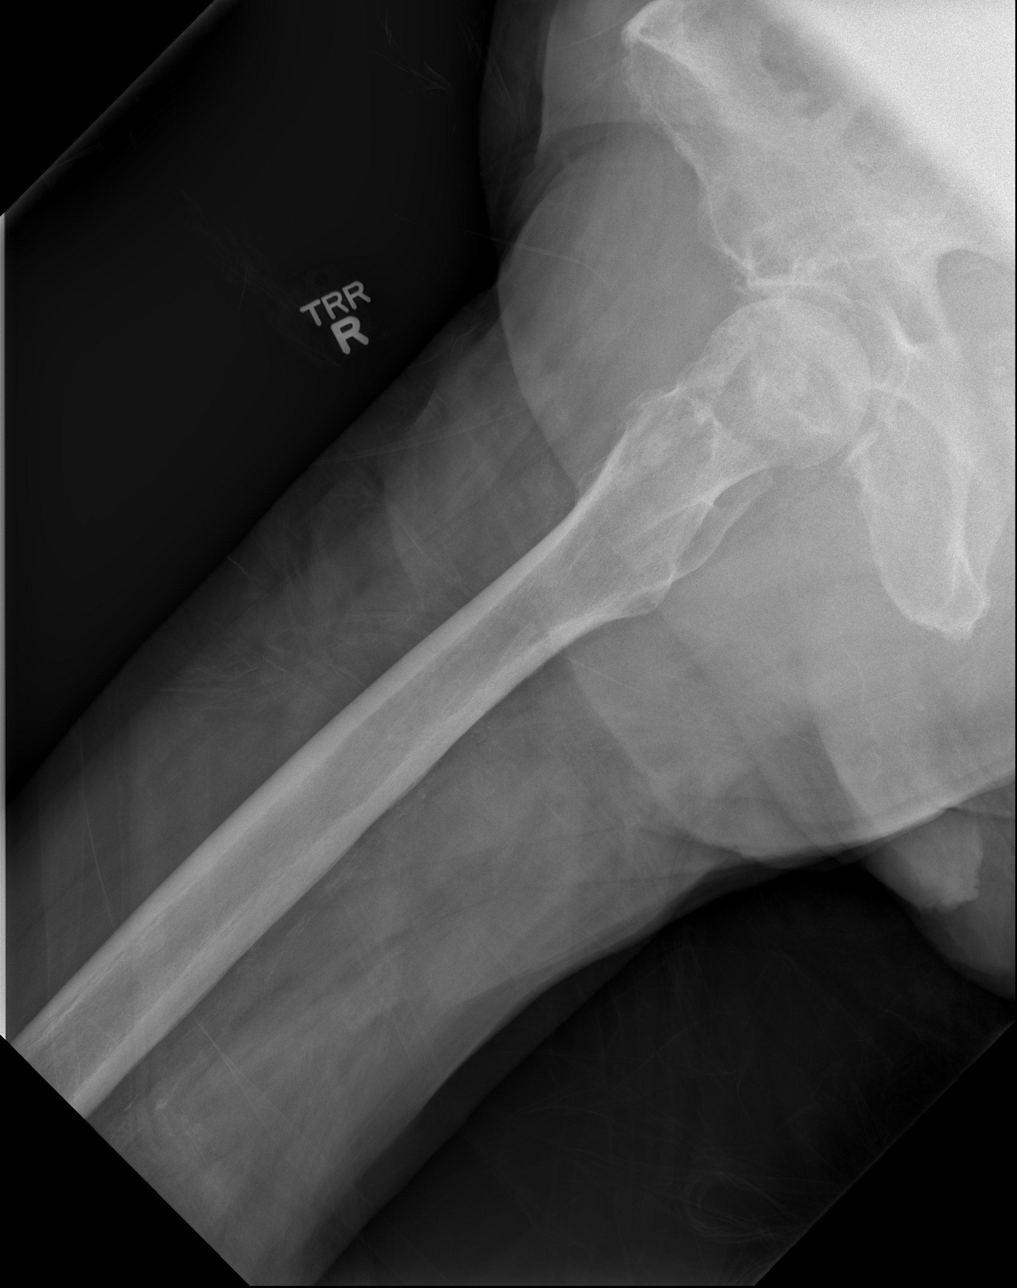

[4 of 4 positions shown; findings below may reference images not displayed]

FINDINGS: No acute fracture or dislocation. Geographic scleroses involving the
right femoral head is unchanged, consistent with avascular necrosis.
No radiographic evidence of subchondral collapse. Moderate left and
mild right hip osteoarthritis. Pubic rami are intact. Advanced
atherosclerosis.
IMPRESSION: Chronic change about both hips.  No acute fracture or dislocation.
